# Patient Record
Sex: Male | Born: 1955 | Race: Black or African American | Hispanic: No | Marital: Single | Smoking: Current every day smoker
Health system: Southern US, Community
[De-identification: ages and names within clinical notes are randomized; demographics above are authoritative.]

## PROBLEM LIST (undated history)

## (undated) DIAGNOSIS — I10 Essential (primary) hypertension: Secondary | ICD-10-CM

## (undated) DIAGNOSIS — E7800 Pure hypercholesterolemia, unspecified: Secondary | ICD-10-CM

## (undated) HISTORY — PX: SMALL INTESTINE SURGERY: SHX150

---

## 2011-08-15 ENCOUNTER — Emergency Department: Payer: Self-pay | Admitting: Unknown Physician Specialty

## 2012-12-20 ENCOUNTER — Ambulatory Visit: Payer: Self-pay | Admitting: Specialist

## 2013-10-31 ENCOUNTER — Ambulatory Visit: Payer: Self-pay | Admitting: Primary Care

## 2013-12-17 ENCOUNTER — Ambulatory Visit: Payer: Self-pay | Admitting: Gastroenterology

## 2013-12-18 LAB — PATHOLOGY REPORT

## 2014-07-11 ENCOUNTER — Ambulatory Visit: Payer: Self-pay

## 2016-04-08 ENCOUNTER — Encounter: Payer: Self-pay | Admitting: Emergency Medicine

## 2016-04-08 ENCOUNTER — Emergency Department: Payer: No Typology Code available for payment source

## 2016-04-08 DIAGNOSIS — Y92410 Unspecified street and highway as the place of occurrence of the external cause: Secondary | ICD-10-CM | POA: Diagnosis not present

## 2016-04-08 DIAGNOSIS — Y999 Unspecified external cause status: Secondary | ICD-10-CM | POA: Insufficient documentation

## 2016-04-08 DIAGNOSIS — F17210 Nicotine dependence, cigarettes, uncomplicated: Secondary | ICD-10-CM | POA: Diagnosis not present

## 2016-04-08 DIAGNOSIS — E7800 Pure hypercholesterolemia, unspecified: Secondary | ICD-10-CM | POA: Diagnosis not present

## 2016-04-08 DIAGNOSIS — I10 Essential (primary) hypertension: Secondary | ICD-10-CM | POA: Diagnosis not present

## 2016-04-08 DIAGNOSIS — M25562 Pain in left knee: Secondary | ICD-10-CM | POA: Insufficient documentation

## 2016-04-08 DIAGNOSIS — Y9389 Activity, other specified: Secondary | ICD-10-CM | POA: Insufficient documentation
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
## 2016-04-08 NOTE — ED Notes (Signed)
Patient was offered IBU for pain but states that he does not want it.

## 2016-04-09 ENCOUNTER — Emergency Department
Admission: EM | Admit: 2016-04-09 | Discharge: 2016-04-09 | Disposition: A | Discharge status: Home / Self Care | Payer: No Typology Code available for payment source | Attending: Emergency Medicine | Admitting: Emergency Medicine

## 2016-04-09 ENCOUNTER — Emergency Department: Payer: No Typology Code available for payment source

## 2016-04-09 DIAGNOSIS — S335XXA Sprain of ligaments of lumbar spine, initial encounter: Secondary | ICD-10-CM

## 2016-04-09 DIAGNOSIS — S8392XA Sprain of unspecified site of left knee, initial encounter: Secondary | ICD-10-CM

## 2016-04-09 HISTORY — DX: Pure hypercholesterolemia, unspecified: E78.00

## 2016-04-09 HISTORY — DX: Essential (primary) hypertension: I10

## 2016-04-09 MED ORDER — IBUPROFEN 50 MG PO CHEW: 50.0000 mg | CHEWABLE_TABLET | Freq: Three times a day (TID) | ORAL | Status: DC | PRN

## 2016-04-09 MED ORDER — CYCLOBENZAPRINE HCL 5 MG PO TABS: 5.0000 mg | ORAL_TABLET | Freq: Three times a day (TID) | ORAL | Status: DC | PRN

## 2016-04-09 MED ORDER — IBUPROFEN 600 MG PO TABS
600.0000 mg | ORAL_TABLET | Freq: Once | ORAL | Status: AC
  Administered 2016-04-09: 600 mg via ORAL
  Filled 2016-04-09: qty 1

## 2016-04-09 MED ORDER — NAPROXEN SODIUM 220 MG PO TABS: 220.0000 mg | ORAL_TABLET | Freq: Two times a day (BID) | ORAL | Status: AC

## 2016-04-09 MED ORDER — CYCLOBENZAPRINE HCL 5 MG PO TABS: 5.0000 mg | ORAL_TABLET | Freq: Three times a day (TID) | ORAL | Status: AC | PRN
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
## 2022-01-03 ENCOUNTER — Encounter: Payer: Self-pay | Admitting: Family Medicine

## 2022-01-03 ENCOUNTER — Ambulatory Visit: Payer: Self-pay | Admitting: Family Medicine

## 2022-01-03 ENCOUNTER — Other Ambulatory Visit: Payer: Self-pay

## 2022-01-03 DIAGNOSIS — Z113 Encounter for screening for infections with a predominantly sexual mode of transmission: Secondary | ICD-10-CM

## 2022-01-03 LAB — HEPATITIS B SURFACE ANTIGEN: Hepatitis B Surface Ag: NONREACTIVE

## 2022-01-03 LAB — HM HIV SCREENING LAB: HM HIV Screening: NEGATIVE

## 2022-01-03 LAB — HM HEPATITIS C SCREENING LAB: HM Hepatitis Screen: NEGATIVE

## 2022-01-03 LAB — GRAM STAIN

## 2022-01-03 NOTE — Progress Notes (Signed)
[facility] STI clinic/screening visit  Subjective:  Walter Murphy is a 66 y.o. male being seen today for an STI screening visit. The patient reports they do have symptoms.    Patient has the following medical conditions:  There are no problems to display for this patient.    Chief Complaint  Patient presents with   SEXUALLY TRANSMITTED DISEASE    screening    HPI  Patient reports here for screening, has sore on penis   Does the patient or their partner desires a pregnancy in the next year? No  Screening for MPX risk: Does the patient have an unexplained rash? No Is the patient MSM? No Does the patient endorse multiple sex partners or anonymous sex partners? No Did the patient have close or sexual contact with a person diagnosed with MPX? No Has the patient traveled outside the [place] where MPX is endemic? No Is there a high clinical suspicion for MPX-- evidenced by one of the following No  -Unlikely to be chickenpox  -Lymphadenopathy  -Rash that present in same phase of evolution on any given body part   See flowsheet for further details and programmatic requirements.    The following portions of the patient's history were reviewed and updated as appropriate: allergies, current medications, past medical history, past social history, past surgical history and problem list.  Objective:  There were no vitals filed for this visit.  Physical Exam Constitutional:      Appearance: Normal appearance.  HENT:     Head: Normocephalic.     Mouth/Throat:     Mouth: Mucous membranes are moist.     Pharynx: Oropharynx is clear. No oropharyngeal exudate.  Abdominal:     Hernia: A hernia is present. Hernia is present in the right inguinal area.  Genitourinary:    Penis: Uncircumcised. Tenderness and lesions present. No discharge or swelling.      Testes: Normal.        Right: Mass, tenderness or swelling not present.        Left: Mass, tenderness or  swelling not present.       Comments: Two open lesions ~ 2 mm diameter,  Pt reports only painful to touch  Musculoskeletal:     Cervical back: Normal range of motion.  Lymphadenopathy:     Cervical: No cervical adenopathy.  Skin:    General: Skin is warm and dry.     Findings: No bruising, erythema, lesion or rash.     Comments: Pt has many marks, moles and calluses on his hands, unable to determine if patient has rash on palms of hands. Smaller macules noted.    Neurological:     Mental Status: He is alert.  Psychiatric:        Mood and Affect: Mood normal.        Behavior: Behavior normal.      Assessment and Plan:  NERIK CONDRAY is a 66 y.o. male presenting to the [facility] Department for STI screening  1. Screening examination for venereal disease Patient does have STI symptoms  Patient accepted all screenings including  gram stain, urethral GC and bloodwork for HIV/RPR.  Patient meets criteria for HepB screening? Yes. Ordered? Yes Patient meets criteria for HepC screening? Yes. Ordered? Yes Recommended condom use with all sex Discussed importance of condom use for STI prevent  Treat gram stain per standing order Discussed time line for State [facility] results and that patient will be called with positive  results and encouraged patient to call if he had not heard in 2 weeks Recommended returning for continued or worsening symptoms.    Discussed with patient to use lubrication with masturbation, to reduce tearing skin.    Pt has 2 ~ 72mm open lesions that are pt reports are non pain unless touched that appeared 1 week ago.   Pt is uncircumcised.   PT screened for HSV.  Discussed treatment if + HSV.       - Gonococcus culture - Gram stain - HBV Antigen/Antibody State Lab - HIV/HCV [facility] - Syphilis Serology, [facility] - Virology, [facility]   No follow-ups on file.  No future appointments.  Junious Dresser, FNP

## 2022-01-03 NOTE — Progress Notes (Signed)
Pt here for STD screening.  Gram stain results reviewed, no treatment required, per SO.  Pt given condoms.  Berdie Ogren, RN

## 2022-01-06 ENCOUNTER — Telehealth: Payer: Self-pay

## 2022-01-06 NOTE — Telephone Encounter (Signed)
Phone call to pt at [phone]. Left message on voicemail that RN with [facility] is calling, please call [phone] to speak with [facility]

## 2022-01-06 NOTE — Telephone Encounter (Signed)
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 Call pt and offer tx per Dr. Lyndel Safe order dated 01/06/22 written on result.

## 2022-01-07 NOTE — Telephone Encounter (Addendum)
Phone call to pt at [phone]. Left message to please give nurse at [facility] a call at [phone], [place].  MyChart is pending.

## 2022-01-08 LAB — GONOCOCCUS CULTURE

## 2022-01-10 NOTE — Telephone Encounter (Signed)
Phone call to pt at [phone]. Left message to please give Trenyce Loera, nurse at [facility], a call at [phone] about test results.

## 2022-01-14 NOTE — Telephone Encounter (Addendum)
Phone call to patient contact number listed in chart, [phone]. Male answered phone and stated that pt is not there, but will get a message to him.

## 2022-01-14 NOTE — Telephone Encounter (Addendum)
Phone call received from pt. Pt confirmed password from last visit. Counseled pt regarding positive HSV result.  Pt does desire to discuss with provider further, in addition to tx options and possible Rx. Pt scheduled in provider clinic for 01/21/22 appt.

## 2022-01-14 NOTE — Telephone Encounter (Signed)
Phone call to pt at [phone]. Left message to please give Tandra Rosado, nurse at [facility], a call at [phone] about test results. ° °

## 2022-01-21 ENCOUNTER — Ambulatory Visit: Payer: Self-pay | Admitting: Family Medicine

## 2022-01-21 ENCOUNTER — Other Ambulatory Visit: Payer: Self-pay

## 2022-01-21 DIAGNOSIS — B009 Herpesviral infection, unspecified: Secondary | ICD-10-CM

## 2022-01-21 MED ORDER — ACYCLOVIR 800 MG PO TABS: 800.0000 mg | ORAL_TABLET | Freq: Two times a day (BID) | ORAL | 11 refills | Status: AC

## 2022-01-24 DIAGNOSIS — B009 Herpesviral infection, unspecified: Secondary | ICD-10-CM | POA: Insufficient documentation

## 2022-01-24 NOTE — Progress Notes (Signed)
S: Pt in clinic desiring retesting.  Pt believes that his TR for HSV is incorrect.    O: PE and lab screening done on 01/03/2022, HSV culture Positive for HSV-2   A: 1. HSV-2 (herpes simplex virus 2) infection - acyclovir (ZOVIRAX) 800 MG tablet; Take 1 tablet (800 mg total) by mouth 2 (two) times daily for 5 days.  Dispense: 10 tablet; Refill: 11  P:  Discussed with patient that because he no longer has a lesion on his penis or anywhere, We are not able to recollect his culture.  Discussed options for serum testing for HSV through his PCP or UC office.  Discussed on sample collection for HSV at [facility].   Pt offered treatment for HSV until he can have serum testing.   Pt agreed for treatment, discussed supressive vs. Episodic treatment.  Pt states "I do not want to take medication every day".   Written RX given to patient to take to [facility] pharmacy.   Pt to call if any questions or concerns.   I spent >15 mins face to face time with this patient discussing HSV and treatment.   Wendi Snipes, FNP
